# Patient Record
Sex: Female | Born: 1990 | Race: Black or African American | Hispanic: No | Marital: Single | Smoking: Former smoker
Health system: Southern US, Community
[De-identification: ages and names within clinical notes are randomized; demographics above are authoritative.]

## PROBLEM LIST (undated history)

## (undated) DIAGNOSIS — Z789 Other specified health status: Secondary | ICD-10-CM

## (undated) HISTORY — PX: NO PAST SURGERIES: SHX2092

---

## 2018-01-21 ENCOUNTER — Encounter: Payer: Self-pay | Admitting: Advanced Practice Midwife

## 2018-01-28 ENCOUNTER — Other Ambulatory Visit (HOSPITAL_COMMUNITY)
Admission: RE | Admit: 2018-01-28 | Discharge: 2018-01-28 | Disposition: A | Discharge status: Home / Self Care | Payer: Medicaid Other | Source: Ambulatory Visit | Attending: Obstetrics & Gynecology | Admitting: Obstetrics & Gynecology

## 2018-01-28 ENCOUNTER — Encounter: Payer: Self-pay | Admitting: Obstetrics & Gynecology

## 2018-01-28 ENCOUNTER — Ambulatory Visit (INDEPENDENT_AMBULATORY_CARE_PROVIDER_SITE_OTHER): Payer: Medicaid Other | Admitting: Obstetrics & Gynecology

## 2018-01-28 VITALS — BP 101/65 | HR 90 | Ht 61.0 in | Wt 103.7 lb

## 2018-01-28 DIAGNOSIS — Z3480 Encounter for supervision of other normal pregnancy, unspecified trimester: Secondary | ICD-10-CM | POA: Insufficient documentation

## 2018-01-28 DIAGNOSIS — O23592 Infection of other part of genital tract in pregnancy, second trimester: Secondary | ICD-10-CM

## 2018-01-28 DIAGNOSIS — N760 Acute vaginitis: Secondary | ICD-10-CM

## 2018-01-28 DIAGNOSIS — Z3A00 Weeks of gestation of pregnancy not specified: Secondary | ICD-10-CM | POA: Diagnosis not present

## 2018-01-28 DIAGNOSIS — Z3A17 17 weeks gestation of pregnancy: Secondary | ICD-10-CM

## 2018-01-28 DIAGNOSIS — Z3482 Encounter for supervision of other normal pregnancy, second trimester: Secondary | ICD-10-CM

## 2018-01-28 NOTE — Progress Notes (Signed)
Subjective:   Hannah Pratt is a 26 y.o. G2P1001 at [redacted]w[redacted]d, by LMP consistent with scan done at [facility], being seen today for her first obstetrical visit.  Her obstetrical history is significant for term SVD  March 2018, still breastfeeding.  Patient does intend to breast feed. Pregnancy history fully reviewed.  Patient reports vaginal irritation and abnormal discharge for a few days.   HISTORY: OB History  Gravida Para Term Preterm AB Living  2 1 1  0 0 1  SAB TAB Ectopic Multiple Live Births  0 0 0 0 1    # Outcome Date GA Lbr Len/2nd Weight Sex Delivery Anes PTL Lv  2 Current           1 Term 06/20/16    M Vag-Spont   LIV  Last pap smear was done 2017 and was normal  History reviewed. No pertinent past medical history. History reviewed. No pertinent surgical history. History reviewed. No pertinent family history. Social History   Tobacco Use  . Smoking status: Never Smoker  . Smokeless tobacco: Never Used  Substance Use Topics  . Alcohol use: Never    Frequency: Never  . Drug use: Yes    Types: Marijuana   No Known Allergies Current Outpatient Medications on File Prior to Visit  Medication Sig Dispense Refill  . Prenatal Vit-Fe Fumarate-FA (PRENATAL MULTIVITAMIN) TABS tablet Take 1 tablet by mouth daily at 12 noon.     No current facility-administered medications on file prior to visit.     Review of Systems Pertinent items noted in HPI and remainder of comprehensive ROS otherwise negative.  Exam   Vitals:   01/28/18 1434 01/28/18 1436  BP: 101/65   Pulse: 90   Weight: 103 lb 11.2 oz (47 kg)   Height:  5\' 1"  (1.549 m)   Fetal Heart Rate (bpm): 155  Uterus:  Fundal Height: 18 cm  Pelvic Exam: Perineum: no hemorrhoids, normal perineum   Vulva: normal external genitalia, no lesions, no erythema   Vagina:  normal mucosa, clumpy yellow discharge seen, testing sample obtained   Cervix: no lesions and normal, pap smear done, with mild bleeding  after pap    Adnexa: normal adnexa and no mass, fullness, tenderness   Bony Pelvis: average  System: General: well-developed, well-nourished female in no acute distress   Breasts:  normal appearance, no masses or tenderness bilaterally   Skin: normal coloration and turgor, no rashes   Neurologic: oriented, normal, negative, normal mood   Extremities: normal strength, tone, and muscle mass, ROM of all joints is normal   HEENT PERRLA, extraocular movement intact and sclera clear, anicteric   Mouth/Teeth mucous membranes moist, pharynx normal without lesions and dental hygiene good   Neck supple and no masses   Cardiovascular: regular rate and rhythm   Respiratory:  no respiratory distress, normal breath sounds   Abdomen: soft, non-tender; bowel sounds normal; no masses,  no organomegaly   Assessment:   Pregnancy: G2P1001 Patient Active Problem List   Diagnosis Date Noted  . Supervision of other normal pregnancy, antepartum 01/28/2018     Plan:  1. Vaginitis affecting pregnancy in second trimester, antepartum - Cervicovaginal ancillary done, will follow up results and manage accordingly.  2. Supervision of other normal pregnancy, antepartum - Cytology - PAP - Obstetric Panel, Including HIV - Hemoglobinopathy evaluation - Cystic Fibrosis Mutation 97 - Genetic Screening - Culture, OB Urine - SMN1 COPY NUMBER ANALYSIS (SMA Carrier Screen) - AFP  TETRA - [place] MFM OB COMP + 14 WK; Future Initial labs drawn. Continue prenatal vitamins. Genetic Screening discussed, Quad screen and NIPS: ordered. Ultrasound discussed; fetal anatomic survey: ordered. Problem list reviewed and updated. The nature of [facility] with multiple MDs and other Advanced Practice Providers was explained to patient; also emphasized that residents, students are part of our team. Routine obstetric precautions reviewed. Return for OB Visit.     Jaynie Collins, MD,  FACOG Obstetrician & Gynecologist, [facility] for [employer], [facility]

## 2018-01-28 NOTE — Patient Instructions (Signed)
Second Trimester of Pregnancy The second trimester is from week 13 through week 28, month 4 through 6. This is often the time in pregnancy that you feel your best. Often times, morning sickness has lessened or quit. You may have more energy, and you may get hungry more often. Your unborn baby (fetus) is growing rapidly. At the end of the sixth month, he or she is about 9 inches long and weighs about 1 pounds. You will likely feel the baby move (quickening) between 18 and 20 weeks of pregnancy.  Research childbirth classes and hospital preregistration at [email or web address]  Follow these instructions at home:  Avoid all smoking, herbs, and alcohol. Avoid drugs not approved by your doctor.  Do not use any tobacco products, including cigarettes, chewing tobacco, and electronic cigarettes. If you need help quitting, ask your doctor. You may get counseling or other support to help you quit.  Only take medicine as told by your doctor. Some medicines are safe and some are not during pregnancy.  Exercise only as told by your doctor. Stop exercising if you start having cramps.  Eat regular, healthy meals.  Wear a good support bra if your breasts are tender.  Do not use hot tubs, steam rooms, or saunas.  Wear your seat belt when driving.  Avoid raw meat, uncooked cheese, and liter boxes and soil used by cats.  Take your prenatal vitamins.  Take 1500-2000 milligrams of calcium daily starting at the 20th week of pregnancy until you deliver your baby.  Try taking medicine that helps you poop (stool softener) as needed, and if your doctor approves. Eat more fiber by eating fresh fruit, vegetables, and whole grains. Drink enough fluids to keep your pee (urine) clear or pale yellow.  Take warm water baths (sitz baths) to soothe pain or discomfort caused by hemorrhoids. Use hemorrhoid cream if your doctor approves.  If you have puffy, bulging veins (varicose veins), wear support hose. Raise  (elevate) your feet for 15 minutes, 3-4 times a day. Limit salt in your diet.  Avoid heavy lifting, wear low heals, and sit up straight.  Rest with your legs raised if you have leg cramps or low back pain.  Visit your dentist if you have not gone during your pregnancy. Use a soft toothbrush to brush your teeth. Be gentle when you floss.  You can have sex (intercourse) unless your doctor tells you not to.  Go to your doctor visits.  Get help if:  You feel dizzy.  You have mild cramps or pressure in your lower belly (abdomen).  You have a nagging pain in your belly area.  You continue to feel sick to your stomach (nauseous), throw up (vomit), or have watery poop (diarrhea).  You have bad smelling fluid coming from your vagina.  You have pain with peeing (urination). Get help right away if:  You have a fever.  You are leaking fluid from your vagina.  You have spotting or bleeding from your vagina.  You have severe belly cramping or pain.  You lose or gain weight rapidly.  You have trouble catching your breath and have chest pain.  You notice sudden or extreme puffiness (swelling) of your face, hands, ankles, feet, or legs.  You have not felt the baby move in over an hour.  You have severe headaches that do not go away with medicine.  You have vision changes. This information is not intended to replace advice given to you by your health care provider. Make   sure you discuss any questions you have with your health care provider. Document Released: 06/28/2009 Document Revised: 09/09/2015 Document Reviewed: 06/04/2012 Elsevier Interactive Patient Education  2017 Elsevier Inc.    

## 2018-01-29 LAB — CERVICOVAGINAL ANCILLARY ONLY
BACTERIAL VAGINITIS: NEGATIVE
Candida vaginitis: POSITIVE — AB
Chlamydia: NEGATIVE
NEISSERIA GONORRHEA: NEGATIVE
Trichomonas: NEGATIVE

## 2018-01-29 LAB — CYTOLOGY - PAP
Chlamydia: NEGATIVE
Diagnosis: NEGATIVE
NEISSERIA GONORRHEA: NEGATIVE

## 2018-01-29 MED ORDER — TERCONAZOLE 0.8 % VA CREA: 1.0000 | TOPICAL_CREAM | Freq: Every day | VAGINAL | 1 refills | Status: DC

## 2018-01-29 NOTE — Progress Notes (Signed)
Vaginal discharge test is abnormal and showed yeast infection. Terazol was prescribed. Please inform patient of results and advise to pick up prescription.

## 2018-01-29 NOTE — Addendum Note (Signed)
Addended by: Jaynie Collins A on: 01/29/2018 06:53 PM   Modules accepted: Orders

## 2018-01-30 LAB — HEMOGLOBINOPATHY EVALUATION
HEMOGLOBIN F QUANTITATION: 0 % (ref 0.0–2.0)
HGB C: 0 %
HGB S: 0 %
HGB VARIANT: 0 %
Hemoglobin A2 Quantitation: 2.6 % (ref 1.8–3.2)
Hgb A: 97.4 % (ref 96.4–98.8)

## 2018-01-30 LAB — OBSTETRIC PANEL, INCLUDING HIV
ANTIBODY SCREEN: NEGATIVE
BASOS: 1 %
Basophils Absolute: 0.1 10*3/uL (ref 0.0–0.2)
EOS (ABSOLUTE): 0.1 10*3/uL (ref 0.0–0.4)
Eos: 1 %
HEMATOCRIT: 36.2 % (ref 34.0–46.6)
HIV SCREEN 4TH GENERATION: NONREACTIVE
Hemoglobin: 12 g/dL (ref 11.1–15.9)
Hepatitis B Surface Ag: NEGATIVE
Immature Grans (Abs): 0 10*3/uL (ref 0.0–0.1)
Immature Granulocytes: 0 %
Lymphocytes Absolute: 2.3 10*3/uL (ref 0.7–3.1)
Lymphs: 30 %
MCH: 28 pg (ref 26.6–33.0)
MCHC: 33.1 g/dL (ref 31.5–35.7)
MCV: 85 fL (ref 79–97)
MONOS ABS: 0.5 10*3/uL (ref 0.1–0.9)
Monocytes: 7 %
NEUTROS ABS: 4.7 10*3/uL (ref 1.4–7.0)
Neutrophils: 61 %
Platelets: 271 10*3/uL (ref 150–450)
RBC: 4.28 x10E6/uL (ref 3.77–5.28)
RDW: 12.2 % — AB (ref 12.3–15.4)
RPR Ser Ql: NONREACTIVE
RUBELLA: 19.3 {index} (ref >0.99)
Rh Factor: POSITIVE
WBC: 7.7 10*3/uL (ref 3.4–10.8)

## 2018-01-31 LAB — AFP TETRA
DIA Mom Value: 1.5
DIA Value (EIA): 311.72 pg/mL
DSR (BY AGE) 1 IN: 998
DSR (Second Trimester) 1 IN: 10000
Gestational Age: 17.4 WEEKS
MSAFP Mom: 1.63
MSAFP: 91.9 ng/mL
MSHCG MOM: 1.29
MSHCG: 51996 m[IU]/mL
Maternal Age At EDD: 25.7 yr
Osb Risk: 3889
Test Results:: NEGATIVE
Weight: 104 [lb_av]
uE3 Mom: 1.41
uE3 Value: 1.9 ng/mL

## 2018-01-31 LAB — URINE CULTURE, OB REFLEX

## 2018-01-31 LAB — CULTURE, OB URINE

## 2018-02-04 ENCOUNTER — Encounter: Payer: Self-pay | Admitting: Obstetrics & Gynecology

## 2018-02-05 LAB — SMN1 COPY NUMBER ANALYSIS (SMA CARRIER SCREENING)

## 2018-02-05 LAB — CYSTIC FIBROSIS MUTATION 97: GENE DIS ANAL CARRIER INTERP BLD/T-IMP: NOT DETECTED

## 2018-02-07 ENCOUNTER — Other Ambulatory Visit (HOSPITAL_COMMUNITY): Payer: Self-pay | Admitting: *Deleted

## 2018-02-07 ENCOUNTER — Ambulatory Visit (HOSPITAL_COMMUNITY)
Admission: RE | Admit: 2018-02-07 | Discharge: 2018-02-07 | Disposition: A | Discharge status: Home / Self Care | Payer: Medicaid Other | Source: Ambulatory Visit | Attending: Obstetrics & Gynecology | Admitting: Obstetrics & Gynecology

## 2018-02-07 ENCOUNTER — Other Ambulatory Visit: Payer: Self-pay | Admitting: Obstetrics & Gynecology

## 2018-02-07 DIAGNOSIS — O43122 Velamentous insertion of umbilical cord, second trimester: Secondary | ICD-10-CM | POA: Insufficient documentation

## 2018-02-07 DIAGNOSIS — Z3A18 18 weeks gestation of pregnancy: Secondary | ICD-10-CM | POA: Insufficient documentation

## 2018-02-07 DIAGNOSIS — Z3480 Encounter for supervision of other normal pregnancy, unspecified trimester: Secondary | ICD-10-CM

## 2018-02-07 DIAGNOSIS — O43192 Other malformation of placenta, second trimester: Secondary | ICD-10-CM | POA: Diagnosis not present

## 2018-02-07 DIAGNOSIS — Z363 Encounter for antenatal screening for malformations: Secondary | ICD-10-CM | POA: Diagnosis not present

## 2018-02-07 DIAGNOSIS — Z362 Encounter for other antenatal screening follow-up: Secondary | ICD-10-CM

## 2018-02-11 ENCOUNTER — Encounter: Payer: Self-pay | Admitting: Advanced Practice Midwife

## 2018-02-18 ENCOUNTER — Encounter: Payer: Medicaid Other | Admitting: Obstetrics and Gynecology

## 2018-02-21 ENCOUNTER — Encounter: Payer: Medicaid Other | Admitting: Obstetrics and Gynecology

## 2018-03-04 ENCOUNTER — Encounter: Payer: Self-pay | Admitting: Obstetrics and Gynecology

## 2018-03-21 ENCOUNTER — Ambulatory Visit (HOSPITAL_COMMUNITY)
Admission: RE | Admit: 2018-03-21 | Discharge: 2018-03-21 | Disposition: A | Discharge status: Home / Self Care | Payer: Medicaid Other | Source: Ambulatory Visit | Attending: Obstetrics & Gynecology | Admitting: Obstetrics & Gynecology

## 2018-03-21 ENCOUNTER — Encounter (HOSPITAL_COMMUNITY): Payer: Self-pay

## 2018-03-21 DIAGNOSIS — Z3A24 24 weeks gestation of pregnancy: Secondary | ICD-10-CM | POA: Diagnosis not present

## 2018-03-21 DIAGNOSIS — O43122 Velamentous insertion of umbilical cord, second trimester: Secondary | ICD-10-CM | POA: Insufficient documentation

## 2018-03-21 DIAGNOSIS — Z362 Encounter for other antenatal screening follow-up: Secondary | ICD-10-CM | POA: Insufficient documentation

## 2018-03-21 HISTORY — DX: Other specified health status: Z78.9

## 2018-03-22 ENCOUNTER — Other Ambulatory Visit (HOSPITAL_COMMUNITY): Payer: Self-pay | Admitting: *Deleted

## 2018-03-22 DIAGNOSIS — O43193 Other malformation of placenta, third trimester: Secondary | ICD-10-CM

## 2018-03-28 LAB — INFORMED CONSENT NEEDED

## 2018-04-17 NOTE — L&D Delivery Note (Signed)
Delivery Note Marco Molock is a 28 y.o. G2P1001 at [redacted]w[redacted]d admitted for SOL.  Labor course: uncomplicated ROM: 0h 80m with moderate meconium-stained fluid  At 0344 a viable and healthy boy was delivered via spontaneous vaginal delivery (Presentation: cephalic; LOA).  Infant placed directly on mom's abdomen for bonding/skin-to-skin. Delayed cord clamping x , then cord clamped x 2, and cut by student midwife.  APGAR: 9,9 ; weight: pending at time of note.  10 units Pitocin IM given as IV was pulled out during delivery. The placenta separated spontaneously and delivered via CCT and maternal pushing effort.  It was inspected and appears to be intact with a 3 VC.  Placenta/Cord with the following complications: none.  Cord pH: n/a  Intrapartum complications:  None Anesthesia:  none Episiotomy: none Lacerations:  1st degree Suture Repair: not repaired Est. Blood Loss (mL): 250 Sponge and instrument count were correct x2.  Mom to postpartum.  Baby to Couplet care / Skin to Skin. Placenta to L&D. Plans to breast feed Contraception: undecided Circ: undecided  Brand Males, SNM 07/06/2018 4:12 AM

## 2018-04-19 ENCOUNTER — Encounter (HOSPITAL_COMMUNITY): Payer: Self-pay

## 2018-04-19 ENCOUNTER — Other Ambulatory Visit (HOSPITAL_COMMUNITY): Payer: Self-pay | Admitting: *Deleted

## 2018-04-19 ENCOUNTER — Ambulatory Visit (HOSPITAL_COMMUNITY)
Admission: RE | Admit: 2018-04-19 | Discharge: 2018-04-19 | Disposition: A | Discharge status: Home / Self Care | Payer: Medicaid Other | Source: Ambulatory Visit | Attending: Obstetrics & Gynecology | Admitting: Obstetrics & Gynecology

## 2018-04-19 DIAGNOSIS — Z362 Encounter for other antenatal screening follow-up: Secondary | ICD-10-CM

## 2018-04-19 DIAGNOSIS — Z3A29 29 weeks gestation of pregnancy: Secondary | ICD-10-CM

## 2018-04-19 DIAGNOSIS — O43193 Other malformation of placenta, third trimester: Secondary | ICD-10-CM | POA: Insufficient documentation

## 2018-05-06 ENCOUNTER — Encounter: Payer: Medicaid Other | Admitting: Obstetrics and Gynecology

## 2018-05-13 ENCOUNTER — Encounter: Payer: Self-pay | Admitting: Obstetrics and Gynecology

## 2018-05-17 ENCOUNTER — Encounter (HOSPITAL_COMMUNITY): Payer: Self-pay

## 2018-05-17 ENCOUNTER — Ambulatory Visit (HOSPITAL_COMMUNITY): Admission: RE | Admit: 2018-05-17 | Payer: Medicaid Other | Source: Ambulatory Visit

## 2018-06-18 ENCOUNTER — Ambulatory Visit (INDEPENDENT_AMBULATORY_CARE_PROVIDER_SITE_OTHER): Payer: Medicaid Other | Admitting: Obstetrics & Gynecology

## 2018-06-18 ENCOUNTER — Other Ambulatory Visit (HOSPITAL_COMMUNITY)
Admission: RE | Admit: 2018-06-18 | Discharge: 2018-06-18 | Disposition: A | Discharge status: Home / Self Care | Payer: Medicaid Other | Source: Ambulatory Visit | Attending: Obstetrics & Gynecology | Admitting: Obstetrics & Gynecology

## 2018-06-18 DIAGNOSIS — Z3480 Encounter for supervision of other normal pregnancy, unspecified trimester: Secondary | ICD-10-CM | POA: Diagnosis not present

## 2018-06-18 DIAGNOSIS — O0930 Supervision of pregnancy with insufficient antenatal care, unspecified trimester: Secondary | ICD-10-CM | POA: Insufficient documentation

## 2018-06-18 DIAGNOSIS — Z3A37 37 weeks gestation of pregnancy: Secondary | ICD-10-CM

## 2018-06-18 DIAGNOSIS — O0933 Supervision of pregnancy with insufficient antenatal care, third trimester: Secondary | ICD-10-CM

## 2018-06-18 NOTE — Progress Notes (Signed)
   PRENATAL VISIT NOTE  Subjective:  Hannah Pratt is a 28 y.o. G2P1001 at [redacted]w[redacted]d being seen today for ongoing prenatal care.  She is currently monitored for the following issues for this low-risk pregnancy and has Supervision of other normal pregnancy, antepartum and Limited prenatal care, antepartum on their problem list.  Patient reports no complaints.  Contractions: Irregular. Vag. Bleeding: None.  Movement: Present. Denies leaking of fluid.   The following portions of the patient's history were reviewed and updated as appropriate: allergies, current medications, past family history, past medical history, past social history, past surgical history and problem list. Problem list updated.  Objective:   Vitals:   06/18/18 1005  BP: 99/67  Pulse: 90  Weight: 121 lb (54.9 kg)    Fetal Status: Fetal Heart Rate (bpm): 145 Fundal Height: 35 cm Movement: Present     General:  Alert, oriented and cooperative. Patient is in no acute distress.  Skin: Skin is warm and dry. No rash noted.   Cardiovascular: Normal heart rate noted  Respiratory: Normal respiratory effort, no problems with respiration noted  Abdomen: Soft, gravid, appropriate for gestational age.  Pain/Pressure: Present     Pelvic: Cervical exam deferred        Extremities: Normal range of motion.  Edema: None  Mental Status: Normal mood and affect. Normal behavior. Normal judgment and thought content.   Assessment and Plan:  Pregnancy: G2P1001 at [redacted]w[redacted]d  1. Supervision of other normal pregnancy, antepartum 36 week labs - Strep Gp B NAA - Cervicovaginal ancillary only( [place])  2. Limited prenatal care, antepartum Received no care in [place] since 01/2018  Term labor symptoms and general obstetric precautions including but not limited to vaginal bleeding, contractions, leaking of fluid and fetal movement were reviewed in detail with the patient. Please refer to After Visit Summary for other counseling recommendations.    Return in about 1 week (around 06/25/2018).  Future Appointments  Date Time Provider Department Center  06/25/2018 11:15 AM Adam Phenix, MD [facility] None    Scheryl Darter, MD

## 2018-06-18 NOTE — Patient Instructions (Signed)

## 2018-06-20 LAB — CERVICOVAGINAL ANCILLARY ONLY
CHLAMYDIA, DNA PROBE: NEGATIVE
NEISSERIA GONORRHEA: NEGATIVE

## 2018-06-20 LAB — STREP GP B NAA: Strep Gp B NAA: NEGATIVE

## 2018-06-25 ENCOUNTER — Encounter: Payer: Self-pay | Admitting: Obstetrics & Gynecology

## 2018-06-25 ENCOUNTER — Ambulatory Visit (INDEPENDENT_AMBULATORY_CARE_PROVIDER_SITE_OTHER): Payer: Medicaid Other | Admitting: Obstetrics & Gynecology

## 2018-06-25 VITALS — BP 96/63 | HR 99 | Wt 122.0 lb

## 2018-06-25 DIAGNOSIS — Z3483 Encounter for supervision of other normal pregnancy, third trimester: Secondary | ICD-10-CM | POA: Diagnosis not present

## 2018-06-25 DIAGNOSIS — Z3A38 38 weeks gestation of pregnancy: Secondary | ICD-10-CM

## 2018-06-25 DIAGNOSIS — Z3480 Encounter for supervision of other normal pregnancy, unspecified trimester: Secondary | ICD-10-CM

## 2018-06-25 NOTE — Patient Instructions (Signed)

## 2018-06-25 NOTE — Progress Notes (Signed)
   PRENATAL VISIT NOTE  Subjective:  Hannah Pratt is a 28 y.o. G2P1001 at [redacted]w[redacted]d being seen today for ongoing prenatal care.  She is currently monitored for the following issues for this high-risk pregnancy and has Supervision of other normal pregnancy, antepartum and Limited prenatal care, antepartum on their problem list.  Patient reports no complaints.  Contractions: Irregular. Vag. Bleeding: None.  Movement: Present. Denies leaking of fluid.   The following portions of the patient's history were reviewed and updated as appropriate: allergies, current medications, past family history, past medical history, past social history, past surgical history and problem list.   Objective:   Vitals:   06/25/18 1110  BP: 96/63  Pulse: 99  Weight: 55.3 kg    Fetal Status: Fetal Heart Rate (bpm): 140   Movement: Present     General:  Alert, oriented and cooperative. Patient is in no acute distress.  Skin: Skin is warm and dry. No rash noted.   Cardiovascular: Normal heart rate noted  Respiratory: Normal respiratory effort, no problems with respiration noted  Abdomen: Soft, gravid, appropriate for gestational age.  Pain/Pressure: Present     Pelvic: Cervical exam deferred        Extremities: Normal range of motion.  Edema: None  Mental Status: Normal mood and affect. Normal behavior. Normal judgment and thought content.   Assessment and Plan:  Pregnancy: G2P1001 at [redacted]w[redacted]d There are no diagnoses linked to this encounter. Term labor symptoms and general obstetric precautions including but not limited to vaginal bleeding, contractions, leaking of fluid and fetal movement were reviewed in detail with the patient. Please refer to After Visit Summary for other counseling recommendations.   Return in about 1 week (around 07/02/2018).  No future appointments.  Scheryl Darter, MD

## 2018-06-25 NOTE — Progress Notes (Signed)
Declines exam.

## 2018-07-05 ENCOUNTER — Encounter: Payer: Medicaid Other | Admitting: Obstetrics and Gynecology

## 2018-07-06 ENCOUNTER — Inpatient Hospital Stay (HOSPITAL_COMMUNITY)
Admission: AD | Admit: 2018-07-06 | Discharge: 2018-07-07 | DRG: 807 | Disposition: A | Discharge status: Home / Self Care | Payer: Medicaid Other | Attending: Obstetrics and Gynecology | Admitting: Obstetrics and Gynecology

## 2018-07-06 ENCOUNTER — Other Ambulatory Visit: Payer: Self-pay

## 2018-07-06 ENCOUNTER — Encounter (HOSPITAL_COMMUNITY): Payer: Self-pay

## 2018-07-06 DIAGNOSIS — O43123 Velamentous insertion of umbilical cord, third trimester: Secondary | ICD-10-CM | POA: Diagnosis present

## 2018-07-06 DIAGNOSIS — O480 Post-term pregnancy: Secondary | ICD-10-CM | POA: Diagnosis not present

## 2018-07-06 DIAGNOSIS — Z3A40 40 weeks gestation of pregnancy: Secondary | ICD-10-CM

## 2018-07-06 DIAGNOSIS — O26893 Other specified pregnancy related conditions, third trimester: Secondary | ICD-10-CM | POA: Diagnosis present

## 2018-07-06 DIAGNOSIS — Z87891 Personal history of nicotine dependence: Secondary | ICD-10-CM

## 2018-07-06 DIAGNOSIS — O0930 Supervision of pregnancy with insufficient antenatal care, unspecified trimester: Secondary | ICD-10-CM

## 2018-07-06 LAB — CBC
HCT: 35 % — ABNORMAL LOW (ref 36.0–46.0)
Hemoglobin: 11 g/dL — ABNORMAL LOW (ref 12.0–15.0)
MCH: 25.8 pg — ABNORMAL LOW (ref 26.0–34.0)
MCHC: 31.4 g/dL (ref 30.0–36.0)
MCV: 82.2 fL (ref 80.0–100.0)
Platelets: 247 10*3/uL (ref 150–400)
RBC: 4.26 MIL/uL (ref 3.87–5.11)
RDW: 14 % (ref 11.5–15.5)
WBC: 6.2 10*3/uL (ref 4.0–10.5)
nRBC: 0 % (ref 0.0–0.2)

## 2018-07-06 LAB — TYPE AND SCREEN
ABO/RH(D): O POS
ANTIBODY SCREEN: NEGATIVE

## 2018-07-06 LAB — RPR: RPR Ser Ql: NONREACTIVE

## 2018-07-06 LAB — ABO/RH: ABO/RH(D): O POS

## 2018-07-06 MED ORDER — COCONUT OIL OIL: 1.0000 "application " | TOPICAL_OIL | Status: DC | PRN

## 2018-07-06 MED ORDER — OXYTOCIN 10 UNIT/ML IJ SOLN
INTRAMUSCULAR | Status: AC
  Filled 2018-07-06: qty 1

## 2018-07-06 MED ORDER — OXYTOCIN 40 UNITS IN NORMAL SALINE INFUSION - SIMPLE MED
2.5000 [IU]/h | INTRAVENOUS | Status: DC
  Filled 2018-07-06: qty 1000

## 2018-07-06 MED ORDER — PRENATAL MULTIVITAMIN CH
1.0000 | ORAL_TABLET | Freq: Every day | ORAL | Status: DC
  Administered 2018-07-06 – 2018-07-07 (×2): 1 via ORAL
  Filled 2018-07-06 (×2): qty 1

## 2018-07-06 MED ORDER — IBUPROFEN 600 MG PO TABS
600.0000 mg | ORAL_TABLET | Freq: Four times a day (QID) | ORAL | Status: DC
  Administered 2018-07-06 – 2018-07-07 (×5): 600 mg via ORAL
  Filled 2018-07-06 (×5): qty 1

## 2018-07-06 MED ORDER — BENZOCAINE-MENTHOL 20-0.5 % EX AERO: 1.0000 "application " | INHALATION_SPRAY | CUTANEOUS | Status: DC | PRN

## 2018-07-06 MED ORDER — SOD CITRATE-CITRIC ACID 500-334 MG/5ML PO SOLN: 30.0000 mL | ORAL | Status: DC | PRN

## 2018-07-06 MED ORDER — OXYCODONE-ACETAMINOPHEN 5-325 MG PO TABS: 1.0000 | ORAL_TABLET | ORAL | Status: DC | PRN

## 2018-07-06 MED ORDER — ZOLPIDEM TARTRATE 5 MG PO TABS: 5.0000 mg | ORAL_TABLET | Freq: Every evening | ORAL | Status: DC | PRN

## 2018-07-06 MED ORDER — ACETAMINOPHEN 325 MG PO TABS
650.0000 mg | ORAL_TABLET | ORAL | Status: DC | PRN
  Administered 2018-07-06: 650 mg via ORAL
  Filled 2018-07-06: qty 2

## 2018-07-06 MED ORDER — ONDANSETRON HCL 4 MG/2ML IJ SOLN: 4.0000 mg | Freq: Four times a day (QID) | INTRAMUSCULAR | Status: DC | PRN

## 2018-07-06 MED ORDER — LACTATED RINGERS IV SOLN: 500.0000 mL | INTRAVENOUS | Status: DC | PRN

## 2018-07-06 MED ORDER — OXYCODONE-ACETAMINOPHEN 5-325 MG PO TABS: 2.0000 | ORAL_TABLET | ORAL | Status: DC | PRN

## 2018-07-06 MED ORDER — OXYTOCIN BOLUS FROM INFUSION: 500.0000 mL | Freq: Once | INTRAVENOUS | Status: DC

## 2018-07-06 MED ORDER — DIPHENHYDRAMINE HCL 25 MG PO CAPS: 25.0000 mg | ORAL_CAPSULE | Freq: Four times a day (QID) | ORAL | Status: DC | PRN

## 2018-07-06 MED ORDER — DIBUCAINE 1 % RE OINT: 1.0000 "application " | TOPICAL_OINTMENT | RECTAL | Status: DC | PRN

## 2018-07-06 MED ORDER — TETANUS-DIPHTH-ACELL PERTUSSIS 5-2.5-18.5 LF-MCG/0.5 IM SUSP: 0.5000 mL | Freq: Once | INTRAMUSCULAR | Status: DC

## 2018-07-06 MED ORDER — ONDANSETRON HCL 4 MG/2ML IJ SOLN: 4.0000 mg | INTRAMUSCULAR | Status: DC | PRN

## 2018-07-06 MED ORDER — SENNOSIDES-DOCUSATE SODIUM 8.6-50 MG PO TABS
2.0000 | ORAL_TABLET | ORAL | Status: DC
  Administered 2018-07-07: 2 via ORAL
  Filled 2018-07-06: qty 2

## 2018-07-06 MED ORDER — ONDANSETRON HCL 4 MG PO TABS: 4.0000 mg | ORAL_TABLET | ORAL | Status: DC | PRN

## 2018-07-06 MED ORDER — WITCH HAZEL-GLYCERIN EX PADS: 1.0000 "application " | MEDICATED_PAD | CUTANEOUS | Status: DC | PRN

## 2018-07-06 MED ORDER — LACTATED RINGERS IV SOLN: INTRAVENOUS | Status: DC

## 2018-07-06 MED ORDER — SIMETHICONE 80 MG PO CHEW: 80.0000 mg | CHEWABLE_TABLET | ORAL | Status: DC | PRN

## 2018-07-06 MED ORDER — LIDOCAINE HCL (PF) 1 % IJ SOLN: 30.0000 mL | INTRAMUSCULAR | Status: DC | PRN

## 2018-07-06 MED ORDER — FLEET ENEMA 7-19 GM/118ML RE ENEM: 1.0000 | ENEMA | RECTAL | Status: DC | PRN

## 2018-07-06 NOTE — H&P (Addendum)
OBSTETRIC ADMISSION HISTORY AND PHYSICAL  Hannah Pratt is a 28 y.o. female G2P1001 with IUP at [redacted]w[redacted]d by ultrasound presenting for SOL. Pt reports contractions started yesterday. Denies LOF or vaginal bleeding. Reports good fetal movement.  -She received limited prenatal care at [facility]- had NOB in October 2019 followed by 2 visits in March 2020 -Support person in labor: Boyfriend  Ultrasounds . Anatomy U/S: Normal, ? Placental cord insertion  Prenatal History/Complications: . Limited prenatal care  Past Medical History: Past Medical History:  Diagnosis Date  . Medical history non-contributory     Past Surgical History: Past Surgical History:  Procedure Laterality Date  . NO PAST SURGERIES      Obstetrical History: OB History    Gravida  2   Para  1   Term  1   Preterm      AB      Living  1     SAB      TAB      Ectopic      Multiple      Live Births  1           Social History: Social History   Socioeconomic History  . Marital status: Single    Spouse name: Not on file  . Number of children: Not on file  . Years of education: Not on file  . Highest education level: Not on file  Occupational History  . Not on file  Social Needs  . Financial resource strain: Not on file  . Food insecurity:    Worry: Not on file    Inability: Not on file  . Transportation needs:    Medical: Not on file    Non-medical: Not on file  Tobacco Use  . Smoking status: Former Games developer  . Smokeless tobacco: Never Used  Substance and Sexual Activity  . Alcohol use: Never    Frequency: Never  . Drug use: Yes    Types: Marijuana  . Sexual activity: Yes    Birth control/protection: None  Lifestyle  . Physical activity:    Days per week: Not on file    Minutes per session: Not on file  . Stress: Not on file  Relationships  . Social connections:    Talks on phone: Not on file    Gets together: Not on file    Attends religious service: Not on file    Active  member of club or organization: Not on file    Attends meetings of clubs or organizations: Not on file    Relationship status: Not on file  Other Topics Concern  . Not on file  Social History Narrative  . Not on file    Family History: No family history on file.  Allergies: No Known Allergies  Medications Prior to Admission  Medication Sig Dispense Refill Last Dose  . Prenatal Vit-Fe Fumarate-FA (PRENATAL MULTIVITAMIN) TABS tablet Take 1 tablet by mouth daily at 12 noon.   Taking     Review of Systems  All systems reviewed and negative except as stated in HPI  Last menstrual period 09/28/2017. General appearance: alert, appears stated age and severe distress Lungs: no respiratory distress Heart: regular rate  Abdomen: soft, non-tender; gravid Extremities: Homans sign is negative, no sign of DVT Presentation: cephalic Fetal monitoring: FHR 140 Uterine activity: contractions q2 mins  Dilation: 8 Effacement (%): 90 Station: -1 Exam by:: Quintella Baton, RNC  Prenatal labs: ABO, Rh: --/--/PENDING (03/21 4920) Antibody: PENDING (03/21 0343) Rubella: 19.30 (  10/14 1532) RPR: Non Reactive (10/14 1532)  HBsAg: Negative (10/14 1532)  HIV: Non Reactive (10/14 1532)  GBS: Negative (03/03 1036)  Glucola: normal Genetic screening: Low risk  Prenatal Transfer Tool  Maternal Diabetes: No- not done Genetic Screening: Normal Maternal Ultrasounds/Referrals: Normal Fetal Ultrasounds or other Referrals:  None Maternal Substance Abuse:  No Significant Maternal Medications:  None Significant Maternal Lab Results: None  Results for orders placed or performed during the hospital encounter of 07/06/18 (from the past 24 hour(s))  Type and screen [facility]   Collection Time: 07/06/18  3:43 AM  Result Value Ref Range   ABO/RH(D) PENDING    Antibody Screen PENDING    Sample Expiration      07/09/2018 Performed at [facility], [address] [place],  [place] [address]     Patient Active Problem List   Diagnosis Date Noted  . Indication for care in labor or delivery 07/06/2018  . Limited prenatal care, antepartum 06/18/2018  . Supervision of other normal pregnancy, antepartum 01/28/2018    Assessment/Plan:  Hannah Pratt is a 28 y.o. G2P1001 at [redacted]w[redacted]d here for SOL.   Labor:  -- Admit to L&D -- Pain control: planning epidural  Fetal Wellbeing:  -- Cephalic by SVE.  -- GBS negative -- Continuous fetal monitoring    Postpartum Planning -- Breast feeding -- Undecided on contraception -- Social work consult for limited prenatal care  Camelia Eng, SNM 04:03 AM  CNM attestation:  I have seen and examined this patient; I agree with above documentation in the nurse midwife's note.   Hannah Pratt is a 28 y.o. G2P1001 here for SOL/advanced labor  PE: Temp 98.1 F (36.7 C) (Oral)   LMP 09/28/2017   Resp: normal effort, no distress Abd: gravid  ROS, labs, PMH reviewed  Plan: Admit to Labor and Delivery Expectant management GBS neg Anticipate SVD Plan SW consult pp due to limited care  Hannah Pratt CNM 07/06/2018, 4:34 AM

## 2018-07-06 NOTE — Discharge Summary (Signed)
Postpartum Discharge Summary     Patient Name: Hannah Pratt DOB: May 02, 1990 MRN: [id number]  Date of admission: 07/06/2018 Delivering Provider: Brand Males   Date of discharge: 07/07/2018  Admitting diagnosis: 40 wks labor Intrauterine pregnancy: [redacted]w[redacted]d     Secondary diagnosis:  Active Problems:   Limited prenatal care, antepartum    Discharge diagnosis: Term Pregnancy Delivered                                                      Postpartum procedures:none Augmentation: none Complications: None  Hospital course:  Onset of Labor With Vaginal Delivery     28 y.o. yo G2P1001 at [redacted]w[redacted]d was admitted in Active Labor/Transition on 07/06/2018. Patient progressed precipitously and had an uncomplicated labor course as follows:  Membrane Rupture Time/Date: 3:43 AM ,07/06/2018   Intrapartum Procedures: Episiotomy: None [1]                                         Lacerations:  None [1]  Patient had a delivery of a Viable infant. 07/06/2018  Information for the patient's newborn:  Efrain, Glatfelter [[id number]]  Delivery Method: Vag-Spont   Patient had an uncomplicated postpartum course.  SW consult was ordered due to limited prenatal care (NOB in Oct, 2 visits in March). She is ambulating, tolerating a regular diet, passing flatus, and urinating well. Patient is discharged home in stable condition on 07/07/18.  Magnesium Sulfate recieved: No BMZ received: No  Physical exam  Vitals:   07/06/18 1345 07/06/18 1740 07/06/18 2311 07/07/18 0538  BP: 97/71 98/62 (!) 99/58 105/73  Pulse: (!) 53 (!) 51 (!) 45 (!) 57  Resp: 18 18 18 18   Temp: 98.5 F (36.9 C) 98.4 F (36.9 C) 97.9 F (36.6 C) 98.3 F (36.8 C)  TempSrc: Oral Oral Oral Oral  SpO2: 100% 100%  100%   General: alert, well-appearing, NAD Lochia: appropriate Uterine Fundus: firm Incision: N/A DVT Evaluation: No significant calf/ankle edema  Labs: Lab Results  Component Value Date   WBC 6.2 07/06/2018   HGB 11.0 (L)  07/06/2018   HCT 35.0 (L) 07/06/2018   MCV 82.2 07/06/2018   PLT 247 07/06/2018   No flowsheet data found.  Discharge instruction: per After Visit Summary and "Baby and Me Booklet".  After visit meds:  Allergies as of 07/07/2018   No Known Allergies     Medication List    TAKE these medications   calcium carbonate 500 MG chewable tablet Commonly known as:  TUMS - dosed in mg elemental calcium Chew 1 tablet by mouth daily as needed for indigestion or heartburn.   ibuprofen 800 MG tablet Commonly known as:  ADVIL,MOTRIN Take 1 tablet (800 mg total) by mouth 3 (three) times daily.   prenatal multivitamin Tabs tablet Take 1 tablet by mouth daily at 12 noon.   senna-docusate 8.6-50 MG tablet Commonly known as:  Senokot-S Take 2 tablets by mouth at bedtime as needed for mild constipation.      Diet: routine diet Activity: Advance as tolerated. Pelvic rest for 6 weeks.   Outpatient follow up:4 weeks Follow up Appt:No future appointments. Follow up Visit: Follow-up Information    [facility]. Schedule an  appointment as soon as possible for a visit.   Specialty:  Obstetrics and Gynecology Why:  You shoudl receive a call to schedule a postpartum visit. If you do not, please call clinic to make an appointment in 4-6 weeks.  Contact information: [address], [address] [place] [address] [phone]         Please schedule this patient for Postpartum visit in: 4 weeks with the following provider: Any provider For C/S patients schedule nurse incision check in weeks 2 weeks: no Low risk pregnancy complicated by: very limited prenatal care Delivery mode:  SVD Anticipated Birth Control:  other/unsure PP Procedures needed: none, unless Pap is due?  Schedule Integrated BH visit: no  Newborn Data: Live born female  Birth Weight:   APGAR: 9, 9  Newborn Delivery   Birth date/time:  07/06/2018 03:44:00 Delivery type:   Vaginal, Spontaneous    Baby Feeding: Both breast and bottle Disposition:home with mother  07/07/2018 Tamera Stands, DO

## 2018-07-06 NOTE — Lactation Note (Addendum)
This note was copied from a baby's chart. Lactation Consultation Note  Patient Name: Hannah Pratt Date: 07/06/2018 Reason for consult: Initial assessment;Term;Other (Comment)(baby 7 hours old / mom exp BF x 18 months / LC encouraged mom to call with feeding cues )  Baby is 7 hours old  LC was called by the L/D Unit secretary to see dyad.  As LC entered the room - mom holding baby STS / and baby sound asleep.  Per mom the baby was a fast delivery and breast feed after delivery.  LC noted 20 mins after delivery / attempt of 5 mins at 1012 and spoon fed 1.5 ml at 1020 a.  LC reassured mom since the baby has eaten after delivery/ and has had colostrum/ and stooled x 1  for now he is ok. LC reviewed with mom what's normal for the baby's age with feedings.  Per mom has a DEBP - Medela at home / and a electric hand pump she hasn't opened yet.  Breast fed her 1st baby 18 months without problems.  Mother informed of post-discharge support and given phone number to the lactation department, including services for phone call assistance; out-patient appointments; and breastfeeding support group. List of other breastfeeding resources in the community given in the handout. Encouraged mother to call for problems or concerns related to breastfeeding.  Mom encouraged to call on her nurses light with feeding cues.      Maternal Data Has patient been taught Hand Expression?: Yes(LD RN showed mom how to hand express/ and per  mom comfortable ) Does the patient have breastfeeding experience prior to this delivery?: Yes  Feeding - ( Latch Score by the L/D RN )  Feeding Type: (last attempt baby was spoon fed 1.5 ml )  LATCH Score Latch: Too sleepy or reluctant, no latch achieved, no sucking elicited.  Audible Swallowing: None  Type of Nipple: Everted at rest and after stimulation  Comfort (Breast/Nipple): Soft / non-tender  Hold (Positioning): Assistance needed to correctly position infant  at breast and maintain latch.  LATCH Score: 5  Interventions Interventions: Breast feeding basics reviewed  Lactation Tools Discussed/Used     Consult Status Consult Status: Follow-up Date: 07/06/18 Follow-up type: In-patient    Matilde Sprang Rupa Lagan 07/06/2018, 10:59 AM

## 2018-07-07 LAB — RAPID URINE DRUG SCREEN, HOSP PERFORMED
Amphetamines: NOT DETECTED
Barbiturates: NOT DETECTED
Benzodiazepines: NOT DETECTED
Cocaine: NOT DETECTED
Opiates: NOT DETECTED
Tetrahydrocannabinol: POSITIVE — AB

## 2018-07-07 MED ORDER — IBUPROFEN 800 MG PO TABS: 800.0000 mg | ORAL_TABLET | Freq: Three times a day (TID) | ORAL | 0 refills | Status: AC

## 2018-07-07 MED ORDER — SENNOSIDES-DOCUSATE SODIUM 8.6-50 MG PO TABS: 2.0000 | ORAL_TABLET | Freq: Every evening | ORAL | Status: AC | PRN

## 2018-07-07 NOTE — Clinical Social Work Maternal (Signed)
CLINICAL SOCIAL WORK MATERNAL/CHILD NOTE  Patient Details  Name: Hannah Pratt MRN: [id number] Date of Birth: 08/29/1990  Date:  07/07/2018  Clinical Social Worker Initiating Note:  Odella Appelhans Poblano, MSW, LCSW-A Date/Time: Initiated:  07/07/18/1545     Child's Name:  Stoney Jackson   Biological Parents:  Mother, Father   Need for Interpreter:  None   Reason for Referral:  Current Substance Use/Substance Use During Pregnancy    Address:  [address] [place] [address]    Phone number:  [phone] (home)     Additional phone number: None  Household Members/Support Persons (HM/SP):   Household Member/Support Person 1, Household Member/Support Person 2   HM/SP Name Relationship DOB or Age  HM/SP -1 Rakwon Jackson FOB 12/20/1995  HM/SP -2 Storm Jackson Child 2 years  HM/SP -3        HM/SP -4        HM/SP -5        HM/SP -6        HM/SP -7        HM/SP -8          Natural Supports (not living in the home):  Extended Family, Friends, Immediate Family   Professional Supports: None   Employment: Unemployed, Student(MOB was employed full time at [employer] until 30 weeks of pregnancy)   Type of Work:     Education:  High school graduate(MOB is studying psychology through [employer])   Homebound arranged:    Financial Resources:  Medicaid   Other Resources:  WIC, Food Stamps    Cultural/Religious Considerations Which May Impact Care:  None  Strengths:  Ability to meet basic needs , Home prepared for child , Pediatrician chosen   Psychotropic Medications:         Pediatrician:    [place] area  Pediatrician List:   [place]    [place] [facility] ([address])  [place]    [place]    [place]    [place]      Pediatrician Fax Number:    Risk Factors/Current Problems:      Cognitive State:  Alert , Able to Concentrate    Mood/Affect:  Calm , Comfortable ,  Bright , Euphoric , Interested , Happy    CSW Assessment: CSW received consult for MOB due to limited prenatal care. CSW met with MOB, newborn Stoney, and FOB Rakwon at bedside to complete assessment. CSW obtained permission from MOB to speak with FOB present. FOB was asleep throughout entire CSW presence. CSW inquired with MOB regarding her limited prenatal care, MOB does agree she only had two OB visits but states she had five ultrasound appointments that she attended throughout pregnancy. MOB denies any barriers related to attending appointments. CSW informed MOB about hospital drug screening policies and MOB did not have questions or concerns. CSW informed MOB that her and newborn were both positive for marijuana. MOB states her use is recreational and that it does not impact her ability to parent or provide for her children. MOB had no concerns with report that was made to [place]. MOB denies any substance use other than marijuana. MOB reports that this is her second child, her oldest is two years old named Storm Jackson. MOB reports she is breastfeeding and that it is going well. MOB reports receiving Medicaid, WIC, and Food Stamps. MOB states her support system is great and it consists of family   and friends. MOB reports having a new car seat for safe transportation of infant with knowledge of installation and use. MOB reports having a bassinet at home for safe sleeping. SIDS precautions thoroughly reviewed with MOB. MOB reports that Stoney will receive pediatric care at [facility] in [place]. MOB reports being employed full time at [employer] up until 30 weeks of pregnancy but stated she is currently a full time student studying psychology through the [employer]. MOB reports that she has a high school diploma but is pursuing her Bachelors degree. MOB reports no further concerns or issues to address at this time. MOB was appropriate, engaged, and expressed gratitude for CSW  interaction.  CSW will continue monitoring chart for cord results and will make additional report if necessary.  CSW Plan/Description:  No Further Intervention Required/No Barriers to Discharge, CSW Will Continue to Monitor Umbilical Cord Tissue Drug Screen Results and Make Report if Warranted    Cindy Fullman L Dubose, LCSW-A 07/07/2018, 4:03 PM  

## 2018-07-07 NOTE — Progress Notes (Signed)
Pt with discharge order but still current order to collect UDS. Called Dr. Earlene Plater. Pt must have UDS collected and see social worker prior to d/c.

## 2018-07-07 NOTE — Progress Notes (Signed)
CSW made report to Donna Wilder at [place] [facility] regarding newborn's positive UDS for marijuana. Report was accepted, follow up will occur after discharge.  No barriers to discharge at this time.  Kathyrn Warmuth Jeffus, MSW, LCSW-A Clinical Social Worker [facility] [phone]   

## 2018-08-05 ENCOUNTER — Ambulatory Visit (INDEPENDENT_AMBULATORY_CARE_PROVIDER_SITE_OTHER): Payer: Medicaid Other | Admitting: Advanced Practice Midwife

## 2018-08-05 ENCOUNTER — Other Ambulatory Visit: Payer: Self-pay

## 2018-08-05 DIAGNOSIS — Z3009 Encounter for other general counseling and advice on contraception: Secondary | ICD-10-CM

## 2018-08-05 NOTE — Progress Notes (Signed)
TELEHEALTH VIRTUAL POSTPARTUM VISIT ENCOUNTER NOTE  I connected with@ on 08/05/18 at  9:55 AM EDT by telephone at home and verified that I am speaking with the correct person using two identifiers.   I discussed the limitations, risks, security and privacy concerns of performing an evaluation and management service by telephone and the availability of in person appointments. I also discussed with the patient that there may be a patient responsible charge related to this service. The patient expressed understanding and agreed to proceed.  Appointment Date: 08/05/2018  OBGYN Clinic: [facility]  Chief Complaint:  Chief Complaint  Patient presents with  . Postpartum Care    History of Present Illness: Hannah Pratt is a 28 y.o. African-American [id number] (No LMP recorded.), seen for the above chief complaint. Her past medical history is significant for normal vaginal delivery at term x 2.  She is s/p normal spontaneous vaginal delivery on 07/06/2018 at 841w1d; she was discharged to home on 07/07/2018. Pregnancy complicated by limited prenatal care, marijuana use. Baby is doing well.  No complaints  Vaginal bleeding or discharge: No  Mode of feeding infant: Breast Intercourse: No  Contraception: no method PP depression s/s: No .  Any bowel or bladder issues: No  Pap smear: no abnormalities (date: 01/28/18)  Review of Systems:  Her 12 point review of systems is negative or as noted in the History of Present Illness.  Patient Active Problem List   Diagnosis Date Noted  . Limited prenatal care, antepartum 06/18/2018  . Supervision of other normal pregnancy, antepartum 01/28/2018    Medications Hannah Pratt had no medications administered during this visit. Current Outpatient Medications  Medication Sig Dispense Refill  . Prenatal Vit-Fe Fumarate-FA (PRENATAL MULTIVITAMIN) TABS tablet Take 1 tablet by mouth daily at 12 noon.    . calcium carbonate (TUMS - DOSED IN MG ELEMENTAL CALCIUM)  500 MG chewable tablet Chew 1 tablet by mouth daily as needed for indigestion or heartburn.    Marland Kitchen. ibuprofen (ADVIL,MOTRIN) 800 MG tablet Take 1 tablet (800 mg total) by mouth 3 (three) times daily. (Patient not taking: Reported on 08/05/2018) 30 tablet 0  . senna-docusate (SENOKOT-S) 8.6-50 MG tablet Take 2 tablets by mouth at bedtime as needed for mild constipation. (Patient not taking: Reported on 08/05/2018)     No current facility-administered medications for this visit.     Allergies Patient has no known allergies.  Physical Exam:  General:  Alert, oriented and cooperative.   Mental Status: Normal mood and affect perceived. Normal judgment and thought content.  Rest of physical exam deferred due to type of encounter  PP Depression Screening:   Edinburgh Postnatal Depression Scale - 08/05/18 1008      Edinburgh Postnatal Depression Scale:  In the Past 7 Days   I have been able to laugh and see the funny side of things.  0    I have looked forward with enjoyment to things.  0    I have blamed myself unnecessarily when things went wrong.  0    I have been anxious or worried for no good reason.  0    I have felt scared or panicky for no good reason.  0    Things have been getting on top of me.  0    I have been so unhappy that I have had difficulty sleeping.  0    I have felt sad or miserable.  0    I have been so unhappy that I  have been crying.  0    The thought of harming myself has occurred to me.  0    Edinburgh Postnatal Depression Scale Total  0       Assessment:Patient is a 28 y.o. [id number] who is 4 weeks postpartum from a normal spontaneous vaginal delivery.  She is doing well.   Plan: 1. Postpartum examination following vaginal delivery --doing well, adjusting to new baby well. Good support at home. No concerns.  2. Encounter for counseling regarding contraception Discussed LARCs as most effective forms of birth control.  Discussed benefits/risks of other methods.  Pt  desires condoms/natural family planning.  Discussed failure rates of condoms with regular use. Discussed lactational amenorrhea and effectiveness.  Pt to contact office if she desires other contraceptive choices.  RTC 1 year  I discussed the assessment and treatment plan with the patient. The patient was provided an opportunity to ask questions and all were answered. The patient agreed with the plan and demonstrated an understanding of the instructions.   The patient was advised to call back or seek an in-person evaluation/go to the ED for any concerning postpartum symptoms.  I provided 10 minutes of non-face-to-face time during this encounter.   Sharen Counter, CNM [facility] [employer], [facility]

## 2019-01-22 DIAGNOSIS — Z5181 Encounter for therapeutic drug level monitoring: Secondary | ICD-10-CM | POA: Diagnosis not present

## 2019-02-06 DIAGNOSIS — Z5181 Encounter for therapeutic drug level monitoring: Secondary | ICD-10-CM | POA: Diagnosis not present

## 2019-02-11 DIAGNOSIS — Z5181 Encounter for therapeutic drug level monitoring: Secondary | ICD-10-CM | POA: Diagnosis not present

## 2019-02-20 DIAGNOSIS — Z5181 Encounter for therapeutic drug level monitoring: Secondary | ICD-10-CM | POA: Diagnosis not present

## 2019-02-26 DIAGNOSIS — Z5181 Encounter for therapeutic drug level monitoring: Secondary | ICD-10-CM | POA: Diagnosis not present

## 2019-03-05 DIAGNOSIS — Z5181 Encounter for therapeutic drug level monitoring: Secondary | ICD-10-CM | POA: Diagnosis not present

## 2019-03-11 DIAGNOSIS — Z5181 Encounter for therapeutic drug level monitoring: Secondary | ICD-10-CM | POA: Diagnosis not present

## 2019-03-18 DIAGNOSIS — Z5181 Encounter for therapeutic drug level monitoring: Secondary | ICD-10-CM | POA: Diagnosis not present

## 2019-04-01 DIAGNOSIS — Z5181 Encounter for therapeutic drug level monitoring: Secondary | ICD-10-CM | POA: Diagnosis not present

## 2019-04-22 DIAGNOSIS — Z5181 Encounter for therapeutic drug level monitoring: Secondary | ICD-10-CM | POA: Diagnosis not present

## 2019-04-29 DIAGNOSIS — Z5181 Encounter for therapeutic drug level monitoring: Secondary | ICD-10-CM | POA: Diagnosis not present

## 2019-05-05 DIAGNOSIS — Z5181 Encounter for therapeutic drug level monitoring: Secondary | ICD-10-CM | POA: Diagnosis not present

## 2019-05-12 DIAGNOSIS — Z5181 Encounter for therapeutic drug level monitoring: Secondary | ICD-10-CM | POA: Diagnosis not present

## 2019-05-20 DIAGNOSIS — Z5181 Encounter for therapeutic drug level monitoring: Secondary | ICD-10-CM | POA: Diagnosis not present

## 2019-05-26 DIAGNOSIS — Z5181 Encounter for therapeutic drug level monitoring: Secondary | ICD-10-CM | POA: Diagnosis not present

## 2019-06-03 DIAGNOSIS — Z5181 Encounter for therapeutic drug level monitoring: Secondary | ICD-10-CM | POA: Diagnosis not present

## 2020-08-03 DIAGNOSIS — Z5181 Encounter for therapeutic drug level monitoring: Secondary | ICD-10-CM | POA: Diagnosis not present

## 2020-08-17 DIAGNOSIS — Z5181 Encounter for therapeutic drug level monitoring: Secondary | ICD-10-CM | POA: Diagnosis not present

## 2020-09-20 IMAGING — US US MFM OB DETAIL+14 WK
1 series · 13 of 28 positions shown · non-contrast
Comparison: none

[Series 1: us mfm ob detail+14 wk · 13 of 94 slices shown]
[im 4/94]
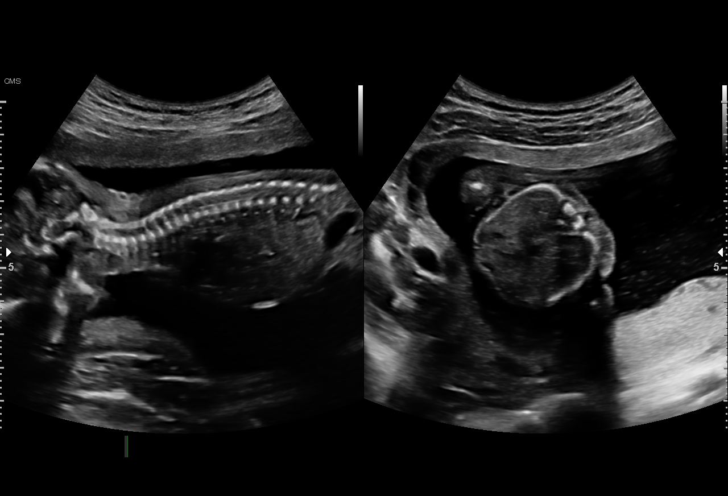
[im 11/94]
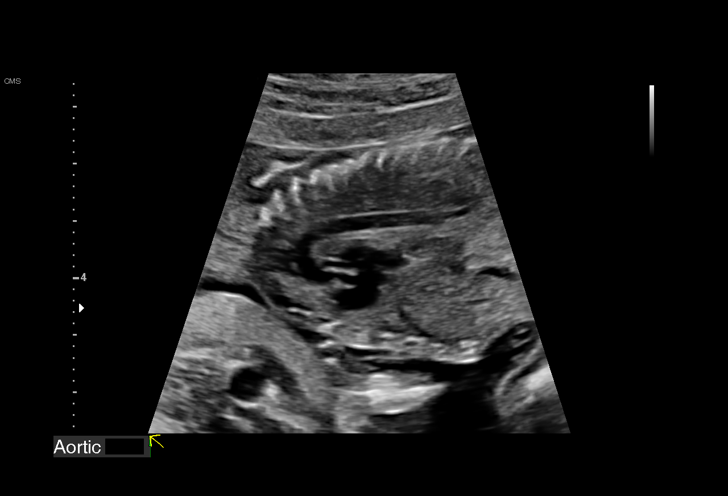
[im 18/94]
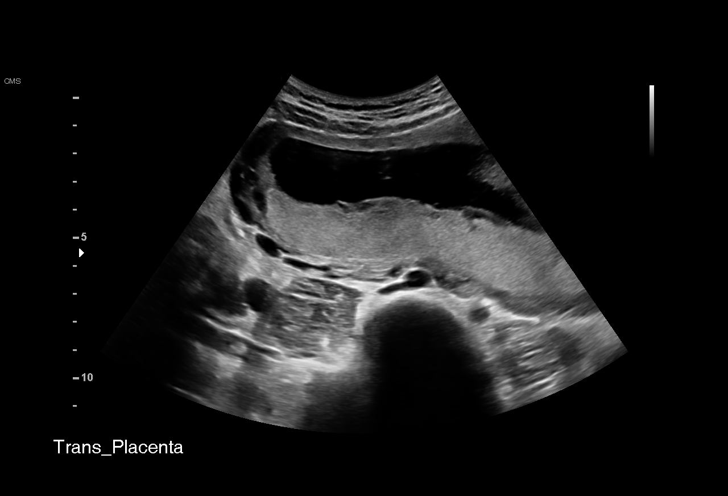
[im 25/94]
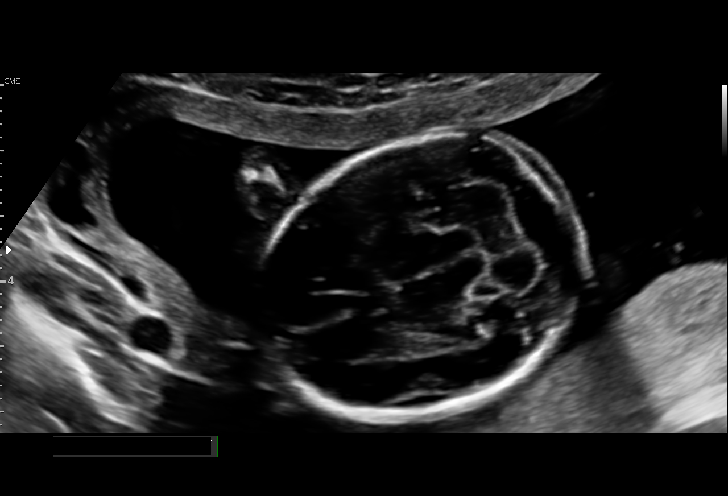
[im 32/94]
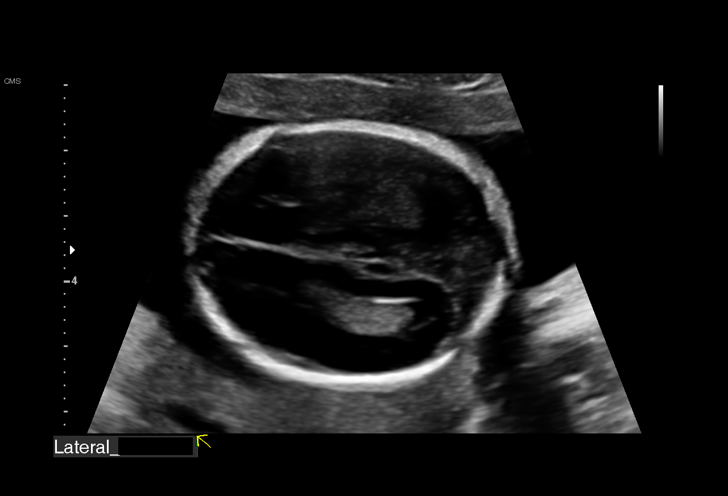
[im 38/94]
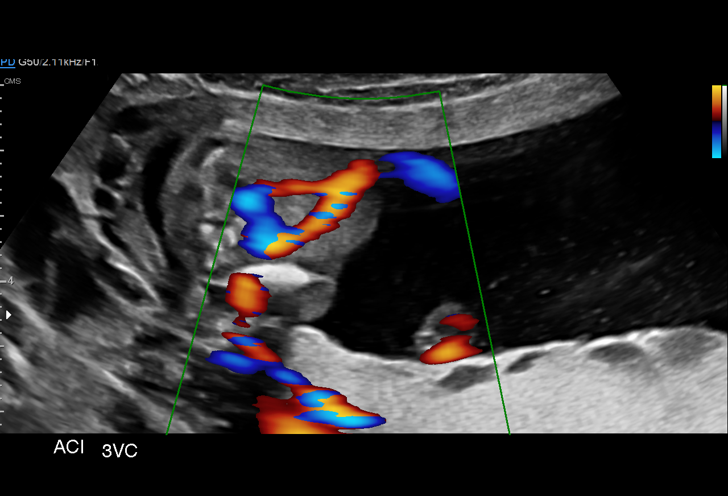
[im 49/94]
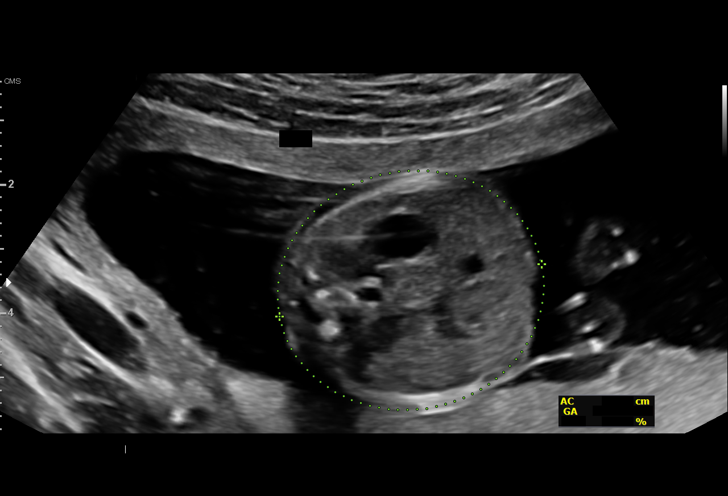
[im 56/94]
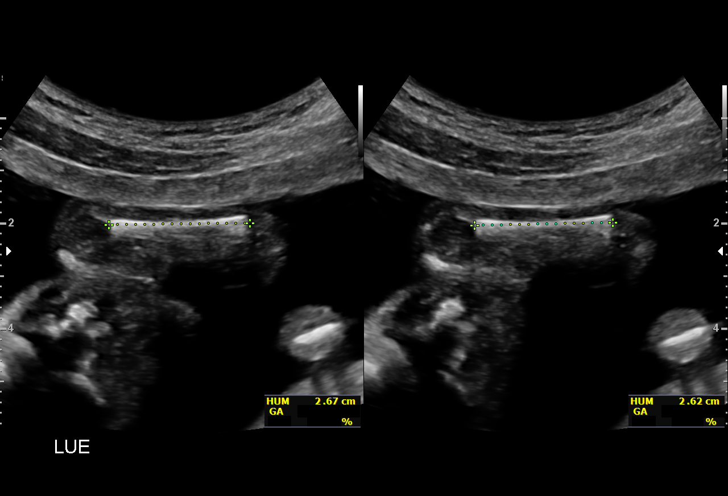
[im 63/94]
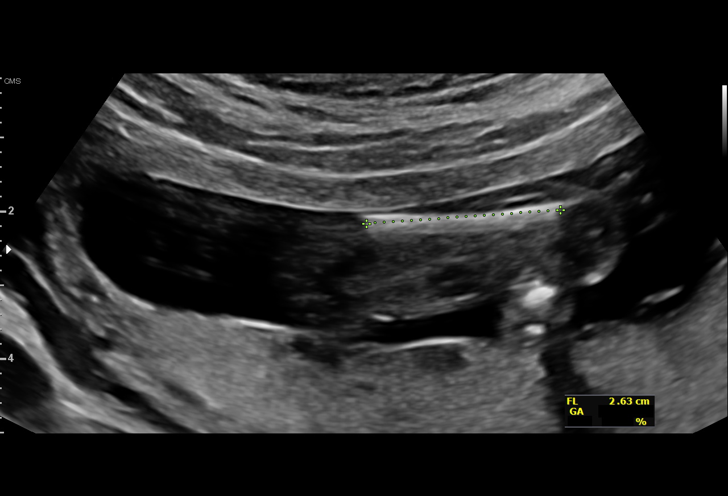
[im 69/94]
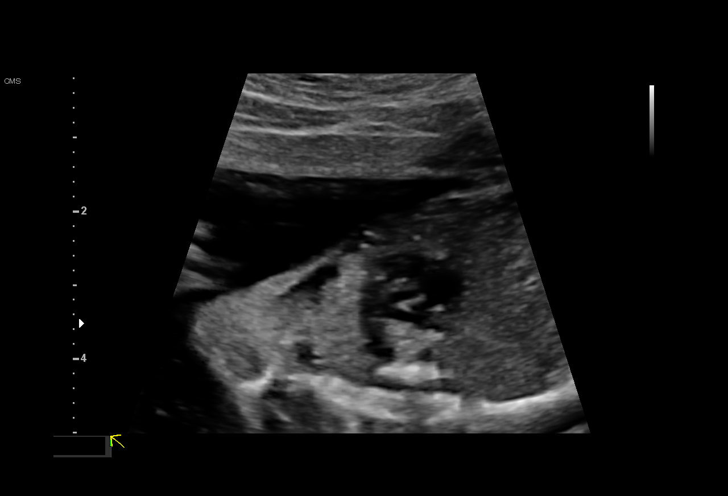
[im 76/94]
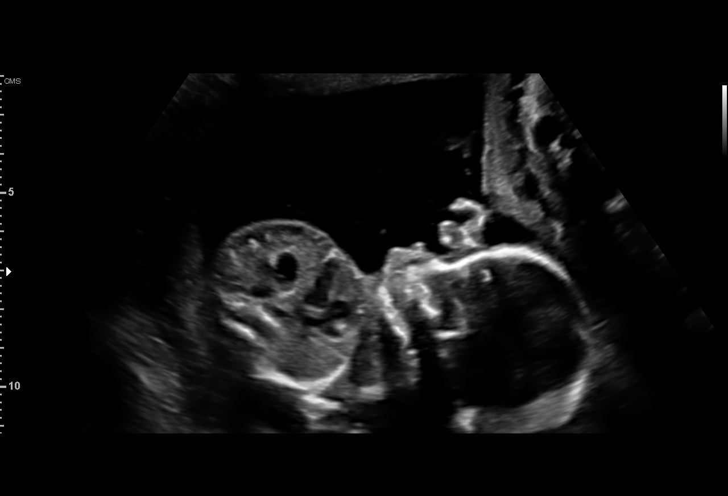
[im 83/94]
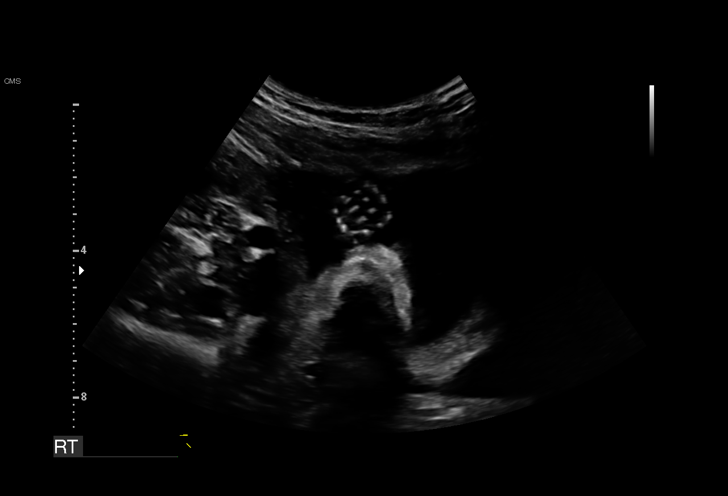
[im 90/94]
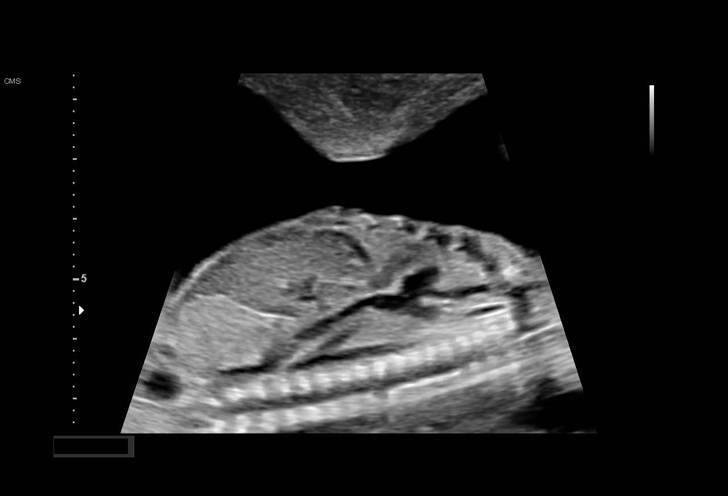

[13 of 28 positions shown; findings below may reference images not displayed]

[REDACTED]care - [HOSPITAL]

Indications

Encounter for antenatal screening for
malformations
Marginal insertion of umbilical cord affecting
management of mother in second trimester
18 weeks gestation of pregnancy
Fetal Evaluation

Num Of Fetuses:         1
Fetal Heart Rate(bpm):  155
Cardiac Activity:       Observed
Presentation:           Variable
Placenta:               Posterior
P. Cord Insertion:      Not well visualized

Amniotic Fluid
AFI FV:      Within normal limits

Largest Pocket(cm)
6.3
Biometry

BPD:      40.6  mm     G. Age:  18w 2d         27  %    CI:        70.78   %    70 - 86
FL/HC:      17.3   %    16.1 -
HC:      153.8  mm     G. Age:  18w 3d         21  %    HC/AC:      1.25        1.09 -
AC:       123   mm     G. Age:  17w 6d         19  %    FL/BPD:     65.5   %
FL:       26.6  mm     G. Age:  18w 1d         19  %    FL/AC:      21.6   %    20 - 24
HUM:      26.5  mm     G. Age:  18w 2d         38  %
CER:      19.3  mm     G. Age:  18w 5d         43  %
NFT:       2.3  mm
CM:        5.9  mm

Est. FW:     222  gm      0 lb 8 oz     30  %
OB History

Gravidity:    2         Term:   1        Prem:   0        SAB:   0
TOP:          0       Ectopic:  0        Living: 1
Gestational Age

LMP:           18w 6d        Date:  09/28/17                 EDD:   07/05/18
U/S Today:     18w 1d                                        EDD:   07/10/18
Best:          18w 6d     Det. By:  LMP  (09/28/17)          EDD:   07/05/18
Anatomy

Cranium:               Appears normal         LVOT:                   Appears normal
Cavum:                 Appears normal         Aortic Arch:            Appears normal
Ventricles:            Appears normal         Ductal Arch:            Appears normal
Choroid Plexus:        Appears normal         Diaphragm:              Appears normal
Cerebellum:            Appears normal         Stomach:                Appears normal, left
sided
Posterior Fossa:       Appears normal         Abdomen:                Appears normal
Nuchal Fold:           Appears normal         Abdominal Wall:         Appears nml (cord
insert, abd wall)
Face:                  Appears normal         Cord Vessels:           Appears normal (3
(orbits and profile)                           vessel cord)
Lips:                  Appears normal         Kidneys:                Appear normal
Palate:                Not well visualized    Bladder:                Appears normal
Thoracic:              Appears normal         Spine:                  Appears normal
Heart:                 Appears normal         Upper Extremities:      Appears normal
(4CH, axis, and
situs)
RVOT:                  Appears normal         Lower Extremities:      Appears normal

Other:  Heels and 5th digit visualized. Open hands visualized. Nasal bone
visualized.
Cervix Uterus Adnexa

Cervix
Length:           3.22  cm.
Normal appearance by transabdominal scan.

Uterus
No abnormality visualized.

Left Ovary
Within normal limits.

Right Ovary
Within normal limits.

Cul De Sac
No free fluid seen.

Adnexa
No abnormality visualized.
Impression

We performed fetal anatomy scan. No makers of
aneuploidies or fetal structural defects are seen. Fetal
biometry is consistent with her previously-established dates.
Amniotic fluid is normal and good fetal activity is seen.
Placental cord insertion was difficult to assess. I explained
this to the patient.

On cell-free fetal DNA screening, the risks of fetal
aneuploidies are not increased. MSAFP screening showed
low risk for open-neural tube defects.
Recommendations

-An appointment was made for her to return in 6 weeks for
fetal growth assessment and placental cord insertion (to rule
out marginal cord insertion).

## 2021-05-20 ENCOUNTER — Other Ambulatory Visit (HOSPITAL_COMMUNITY): Payer: Self-pay
